# Patient Record
Sex: Female | Born: 1980 | Hispanic: Yes | Marital: Married
Health system: Southern US, Community
[De-identification: ages and names within clinical notes are randomized; demographics above are authoritative.]

## PROBLEM LIST (undated history)

## (undated) DIAGNOSIS — Z789 Other specified health status: Secondary | ICD-10-CM

---

## 2007-04-17 ENCOUNTER — Ambulatory Visit: Payer: Self-pay | Admitting: Family Medicine

## 2007-06-02 ENCOUNTER — Observation Stay: Payer: Self-pay

## 2007-08-13 ENCOUNTER — Encounter: Payer: Self-pay | Admitting: Maternal and Fetal Medicine

## 2007-09-29 ENCOUNTER — Observation Stay: Payer: Self-pay | Admitting: Obstetrics and Gynecology

## 2007-10-08 ENCOUNTER — Inpatient Hospital Stay: Payer: Self-pay

## 2008-03-28 ENCOUNTER — Ambulatory Visit: Payer: Self-pay | Admitting: Family Medicine

## 2009-11-16 ENCOUNTER — Ambulatory Visit: Payer: Self-pay | Admitting: Certified Nurse Midwife

## 2010-05-02 ENCOUNTER — Inpatient Hospital Stay: Payer: Self-pay | Admitting: Obstetrics and Gynecology

## 2012-04-17 ENCOUNTER — Emergency Department: Payer: Self-pay | Admitting: Emergency Medicine

## 2012-04-17 LAB — CBC WITH DIFFERENTIAL/PLATELET
Basophil %: 0.4 %
HCT: 36.1 % (ref 35.0–47.0)
HGB: 12 g/dL (ref 12.0–16.0)
Lymphocyte #: 3 10*3/uL (ref 1.0–3.6)
Lymphocyte %: 40.7 %
MCHC: 33.3 g/dL (ref 32.0–36.0)
MCV: 83 fL (ref 80–100)
Monocyte #: 0.4 x10 3/mm (ref 0.2–0.9)
Neutrophil #: 3.8 10*3/uL (ref 1.4–6.5)
Neutrophil %: 50.9 %
Platelet: 432 10*3/uL (ref 150–440)
RBC: 4.37 10*6/uL (ref 3.80–5.20)

## 2012-04-17 LAB — URINALYSIS, COMPLETE
Ph: 6 (ref 4.5–8.0)
RBC,UR: <1 /HPF (ref 0–5)
Specific Gravity: 1.005 (ref 1.003–1.030)
WBC UR: 2 /HPF (ref 0–5)

## 2012-04-17 LAB — LIPASE, BLOOD: Lipase: 100 U/L (ref 73–393)

## 2012-04-17 LAB — COMPREHENSIVE METABOLIC PANEL
Alkaline Phosphatase: 114 U/L (ref 50–136)
Anion Gap: 10 (ref 7–16)
BUN: 8 mg/dL (ref 7–18)
Bilirubin,Total: 0.3 mg/dL (ref 0.2–1.0)
Calcium, Total: 8.9 mg/dL (ref 8.5–10.1)
Chloride: 103 mmol/L (ref 98–107)
Creatinine: 0.43 mg/dL — ABNORMAL LOW (ref 0.60–1.30)
EGFR (African American): >60
Potassium: 4 mmol/L (ref 3.5–5.1)
SGOT(AST): 29 U/L (ref 15–37)
SGPT (ALT): 42 U/L
Total Protein: 8.1 g/dL (ref 6.4–8.2)

## 2012-06-08 ENCOUNTER — Ambulatory Visit: Payer: Self-pay | Admitting: Surgery

## 2012-06-08 LAB — PREGNANCY, URINE: Pregnancy Test, Urine: NEGATIVE m[IU]/mL

## 2012-06-10 ENCOUNTER — Ambulatory Visit: Payer: Self-pay | Admitting: Surgery

## 2012-06-11 LAB — PATHOLOGY REPORT

## 2014-09-09 ENCOUNTER — Emergency Department: Payer: Self-pay | Admitting: Emergency Medicine

## 2014-09-09 LAB — CBC
HCT: 34.7 % — AB (ref 35.0–47.0)
HGB: 11.5 g/dL — ABNORMAL LOW (ref 12.0–16.0)
MCH: 27.7 pg (ref 26.0–34.0)
MCHC: 33.2 g/dL (ref 32.0–36.0)
MCV: 84 fL (ref 80–100)
PLATELETS: 344 10*3/uL (ref 150–440)
RBC: 4.15 10*6/uL (ref 3.80–5.20)
RDW: 14.6 % — ABNORMAL HIGH (ref 11.5–14.5)
WBC: 8.9 10*3/uL (ref 3.6–11.0)

## 2014-09-09 LAB — HCG, QUANTITATIVE, PREGNANCY: Beta Hcg, Quant.: 76209 m[IU]/mL — ABNORMAL HIGH

## 2014-09-10 LAB — WET PREP, GENITAL

## 2014-09-11 LAB — GC/CHLAMYDIA PROBE AMP

## 2014-11-04 ENCOUNTER — Ambulatory Visit: Payer: Self-pay | Admitting: Family Medicine

## 2015-02-07 NOTE — Op Note (Signed)
PATIENT NAME:  Kathleen Lang, [name] MR#:  [id number] DATE OF BIRTH:  1981/07/02  DATE OF PROCEDURE:  06/10/2012  PREOPERATIVE DIAGNOSIS: Chronic cholecystitis, cholelithiasis.   POSTOPERATIVE DIAGNOSIS: Chronic cholecystitis, cholelithiasis.  PROCEDURE: Laparoscopic cholecystectomy.   SURGEON: Adella HareJ. Wilton Smith, MD   ANESTHESIA: General.   INDICATIONS: This 34 year old female has a history of epigastric pain. She had ultrasound findings of gallstones and surgery was recommended for definitive treatment.   DESCRIPTION OF PROCEDURE: The patient was placed on the operating table in the supine position under general endotracheal anesthesia. The abdomen was prepared with ChloraPrep and draped in a sterile manner.   An incision was made deeply within the umbilicus approximately 12 mm in length, carried down through subcutaneous tissues. The deep fascia was grasped with laryngeal hook and elevated. A Veress needle was inserted, aspirated, and irrigated with a saline solution. Next, the peritoneal cavity was inflated with carbon dioxide. The Veress needle was removed. The 10 mm cannula was inserted. The 10 mm 0 degree laparoscope was inserted to view the peritoneal cavity. Initial inspection revealed there was some gaseous distention of the stomach and I had the anesthetist insert an orogastric tube to decompress the stomach. The liver had a fatty appearance with a smooth surface. Other parts of intestine and omentum were otherwise typical. Another incision was made in the epigastrium slightly to the right of the midline to introduce an 11 mm cannula. Two incisions were made in the lateral aspect of the right upper quadrant to introduce two 5-mm cannulas.   The patient was placed in the reverse Trendelenburg position and turned some 5 degrees to the left. The gallbladder was retracted towards the right shoulder. Multiple adhesions were taken down with blunt and sharp dissection. The neck of the  gallbladder was retracted inferiorly and laterally. The porta hepatis was demonstrated. The cystic duct was dissected free from surrounding structures. The cystic artery was dissected free from surrounding structures. The neck of the gallbladder was mobilized with incision of the visceral peritoneum. A critical view of safety was demonstrated. An Endoclip was placed across the cystic duct adjacent to the neck of the gallbladder. An incision was made in the cystic duct to thread the Reddick catheter, however, the cystic duct was found to be very small and the Reddick catheter would not thread and, therefore, cholangiogram was not done. The cystic duct was doubly ligated with endoclips and divided. The cystic artery was controlled with double endoclips and divided. The gallbladder was further dissected free from the liver with hook and cautery. Bleeding was very minimal. Hemostasis was subsequently intact. Just a small amount of blood was aspirated. The gallbladder was completely separated and was brought up through the infraumbilical incision, opened and suctioned. Multiple stones were removed with the stone scoop and the gallbladder with stones was submitted for routine pathology. The right upper quadrant was further inspected. Hemostasis was intact. The cannulas were removed. Carbon dioxide was allowed to escape from the peritoneal cavity. Skin incisions were closed with interrupted 5-0 chromic subcuticular sutures, benzoin, and Steri-Strips. Dressings were applied with paper tape. The patient tolerated surgery satisfactorily and was then prepared for transfer to the recovery room.   ____________________________ Shela CommonsJ. Renda RollsWilton Smith, MD jws:drc D: 06/10/2012 13:06:49 ET T: 06/10/2012 13:28:06 ET JOB#: [id number]  cc: Adella HareJ. Wilton Smith, MD, <Dictator> Adella HareWILTON J SMITH MD ELECTRONICALLY SIGNED 06/11/2012 20:20

## 2015-04-05 ENCOUNTER — Inpatient Hospital Stay
Admission: EM | Admit: 2015-04-05 | Discharge: 2015-04-08 | DRG: 765 | Disposition: A | Discharge status: Home / Self Care | Payer: Medicaid Other | Attending: Obstetrics and Gynecology | Admitting: Obstetrics and Gynecology

## 2015-04-05 ENCOUNTER — Inpatient Hospital Stay: Payer: Medicaid Other | Admitting: Anesthesiology

## 2015-04-05 ENCOUNTER — Encounter
Admission: EM | Disposition: A | Discharge status: Home / Self Care | Payer: Self-pay | Source: Home / Self Care | Attending: Obstetrics and Gynecology

## 2015-04-05 DIAGNOSIS — O2653 Maternal hypotension syndrome, third trimester: Secondary | ICD-10-CM | POA: Diagnosis not present

## 2015-04-05 DIAGNOSIS — O321XX0 Maternal care for breech presentation, not applicable or unspecified: Principal | ICD-10-CM | POA: Diagnosis present

## 2015-04-05 DIAGNOSIS — R42 Dizziness and giddiness: Secondary | ICD-10-CM | POA: Diagnosis not present

## 2015-04-05 DIAGNOSIS — Z302 Encounter for sterilization: Secondary | ICD-10-CM | POA: Diagnosis not present

## 2015-04-05 DIAGNOSIS — O480 Post-term pregnancy: Secondary | ICD-10-CM | POA: Diagnosis present

## 2015-04-05 DIAGNOSIS — Z23 Encounter for immunization: Secondary | ICD-10-CM

## 2015-04-05 DIAGNOSIS — Z3A39 39 weeks gestation of pregnancy: Secondary | ICD-10-CM | POA: Diagnosis present

## 2015-04-05 DIAGNOSIS — Z98891 History of uterine scar from previous surgery: Secondary | ICD-10-CM

## 2015-04-05 HISTORY — DX: Other specified health status: Z78.9

## 2015-04-05 LAB — CBC
HCT: 33.2 % — ABNORMAL LOW (ref 35.0–47.0)
HEMATOCRIT: 32.4 % — AB (ref 35.0–47.0)
HEMOGLOBIN: 9.8 g/dL — AB (ref 12.0–16.0)
Hemoglobin: 10.3 g/dL — ABNORMAL LOW (ref 12.0–16.0)
MCH: 22.3 pg — ABNORMAL LOW (ref 26.0–34.0)
MCH: 23.9 pg — AB (ref 26.0–34.0)
MCHC: 30.3 g/dL — ABNORMAL LOW (ref 32.0–36.0)
MCHC: 31 g/dL — AB (ref 32.0–36.0)
MCV: 73.6 fL — AB (ref 80.0–100.0)
MCV: 77.1 fL — ABNORMAL LOW (ref 80.0–100.0)
Platelets: 424 10*3/uL (ref 150–440)
Platelets: 320 10*3/uL (ref 150–440)
RBC: 4.4 MIL/uL (ref 3.80–5.20)
RBC: 4.3 MIL/uL (ref 3.80–5.20)
RDW: 16.8 % — AB (ref 11.5–14.5)
RDW: 18 % — AB (ref 11.5–14.5)
WBC: 14.8 10*3/uL — ABNORMAL HIGH (ref 3.6–11.0)
WBC: 17.3 10*3/uL — ABNORMAL HIGH (ref 3.6–11.0)

## 2015-04-05 LAB — CBC WITH DIFFERENTIAL/PLATELET
Basophils Absolute: 0 10*3/uL (ref 0–0.1)
Basophils Relative: 0 %
Eosinophils Absolute: 0 10*3/uL (ref 0–0.7)
HCT: 24 % — ABNORMAL LOW (ref 35.0–47.0)
Hemoglobin: 7.7 g/dL — ABNORMAL LOW (ref 12.0–16.0)
LYMPHS ABS: 2 10*3/uL (ref 1.0–3.6)
Lymphocytes Relative: 16 %
MCH: 23.6 pg — ABNORMAL LOW (ref 26.0–34.0)
MCHC: 31.9 g/dL — ABNORMAL LOW (ref 32.0–36.0)
MCV: 74 fL — AB (ref 80.0–100.0)
Monocytes Absolute: 0.6 10*3/uL (ref 0.2–0.9)
Monocytes Relative: 5 %
Neutro Abs: 10 10*3/uL — ABNORMAL HIGH (ref 1.4–6.5)
Neutrophils Relative %: 79 %
Platelets: 229 10*3/uL (ref 150–440)
RBC: 3.24 MIL/uL — ABNORMAL LOW (ref 3.80–5.20)
RDW: 17.6 % — ABNORMAL HIGH (ref 11.5–14.5)
WBC: 12.6 10*3/uL — ABNORMAL HIGH (ref 3.6–11.0)

## 2015-04-05 LAB — PROTIME-INR
INR: 1.24
INR: 1.09
PROTHROMBIN TIME: 15.8 s — AB (ref 11.4–15.0)
Prothrombin Time: 14.3 seconds (ref 11.4–15.0)

## 2015-04-05 LAB — FIBRINOGEN
FIBRINOGEN: 287 mg/dL (ref 210–470)
FIBRINOGEN: 385 mg/dL (ref 210–470)

## 2015-04-05 LAB — OB RESULTS CONSOLE HEPATITIS B SURFACE ANTIGEN: Hepatitis B Surface Ag: NEGATIVE

## 2015-04-05 LAB — APTT
aPTT: 26 seconds (ref 24–36)
aPTT: 28 seconds (ref 24–36)

## 2015-04-05 LAB — OB RESULTS CONSOLE HIV ANTIBODY (ROUTINE TESTING): HIV: NONREACTIVE

## 2015-04-05 LAB — PREPARE RBC (CROSSMATCH)

## 2015-04-05 LAB — ABO/RH: ABO/RH(D): A POS

## 2015-04-05 LAB — OB RESULTS CONSOLE GBS: GBS: NEGATIVE

## 2015-04-05 SURGERY — Surgical Case
Anesthesia: Spinal | Wound class: Clean Contaminated

## 2015-04-05 MED ORDER — METHYLERGONOVINE MALEATE 0.2 MG/ML IJ SOLN
INTRAMUSCULAR | Status: AC
  Administered 2015-04-05: .2 mg via INTRAMUSCULAR
  Filled 2015-04-05: qty 1

## 2015-04-05 MED ORDER — MENTHOL 3 MG MT LOZG: 1.0000 | LOZENGE | OROMUCOSAL | Status: DC | PRN

## 2015-04-05 MED ORDER — BUPIVACAINE IN DEXTROSE 0.75-8.25 % IT SOLN
INTRATHECAL | Status: DC | PRN
Start: 2015-04-05 — End: 2015-04-05

## 2015-04-05 MED ORDER — ONDANSETRON HCL 4 MG/2ML IJ SOLN: 4.0000 mg | Freq: Three times a day (TID) | INTRAMUSCULAR | Status: DC | PRN

## 2015-04-05 MED ORDER — SIMETHICONE 80 MG PO CHEW
80.0000 mg | CHEWABLE_TABLET | Freq: Three times a day (TID) | ORAL | Status: DC
  Administered 2015-04-05 – 2015-04-07 (×2): 80 mg via ORAL
  Filled 2015-04-05 (×4): qty 1

## 2015-04-05 MED ORDER — LACTATED RINGERS IV SOLN: INTRAVENOUS | Status: DC

## 2015-04-05 MED ORDER — LANOLIN HYDROUS EX OINT
1.0000 | TOPICAL_OINTMENT | CUTANEOUS | Status: DC | PRN
Start: 2015-04-05 — End: 2015-04-08

## 2015-04-05 MED ORDER — FENTANYL CITRATE (PF) 100 MCG/2ML IJ SOLN
INTRAMUSCULAR | Status: DC | PRN
  Administered 2015-04-05: 15 ug via INTRATHECAL

## 2015-04-05 MED ORDER — ONDANSETRON HCL 4 MG/2ML IJ SOLN: 4.0000 mg | Freq: Once | INTRAMUSCULAR | Status: DC | PRN

## 2015-04-05 MED ORDER — LACTATED RINGERS IV SOLN: 500.0000 mL | INTRAVENOUS | Status: DC | PRN

## 2015-04-05 MED ORDER — OXYCODONE-ACETAMINOPHEN 5-325 MG PO TABS
2.0000 | ORAL_TABLET | ORAL | Status: DC | PRN
  Administered 2015-04-06 – 2015-04-07 (×3): 2 via ORAL
  Filled 2015-04-05 (×2): qty 2

## 2015-04-05 MED ORDER — TETANUS-DIPHTH-ACELL PERTUSSIS 5-2.5-18.5 LF-MCG/0.5 IM SUSP
0.5000 mL | Freq: Once | INTRAMUSCULAR | Status: AC
  Administered 2015-04-08: 0.5 mL via INTRAMUSCULAR
  Filled 2015-04-05: qty 0.5

## 2015-04-05 MED ORDER — MEPERIDINE HCL 25 MG/ML IJ SOLN
6.2500 mg | INTRAMUSCULAR | Status: DC | PRN
  Administered 2015-04-05: 6.25 mg via INTRAVENOUS
  Filled 2015-04-05: qty 1

## 2015-04-05 MED ORDER — NALBUPHINE HCL 10 MG/ML IJ SOLN
5.0000 mg | Freq: Once | INTRAMUSCULAR | Status: AC | PRN
  Filled 2015-04-05: qty 0.5

## 2015-04-05 MED ORDER — BUPIVACAINE HCL (PF) 0.5 % IJ SOLN
INTRAMUSCULAR | Status: DC | PRN
  Administered 2015-04-05: 10 mL

## 2015-04-05 MED ORDER — OXYTOCIN 40 UNITS IN LACTATED RINGERS INFUSION - SIMPLE MED
INTRAVENOUS | Status: DC | PRN
  Administered 2015-04-05: 40 mL via INTRAVENOUS
  Administered 2015-04-05: 40 [IU]

## 2015-04-05 MED ORDER — EPHEDRINE SULFATE 50 MG/ML IJ SOLN
INTRAMUSCULAR | Status: DC | PRN
  Administered 2015-04-05 (×3): 10 mg via INTRAVENOUS

## 2015-04-05 MED ORDER — ONDANSETRON HCL 4 MG/2ML IJ SOLN
INTRAMUSCULAR | Status: DC | PRN
  Administered 2015-04-05: 4 mg via INTRAVENOUS

## 2015-04-05 MED ORDER — WITCH HAZEL-GLYCERIN EX PADS: 1.0000 "application " | MEDICATED_PAD | CUTANEOUS | Status: DC | PRN

## 2015-04-05 MED ORDER — SODIUM CHLORIDE 0.9 % IJ SOLN
INTRAMUSCULAR | Status: AC
  Administered 2015-04-05: 3 mL via INTRAVENOUS
  Filled 2015-04-05: qty 3

## 2015-04-05 MED ORDER — OXYCODONE-ACETAMINOPHEN 5-325 MG PO TABS: 1.0000 | ORAL_TABLET | ORAL | Status: DC | PRN

## 2015-04-05 MED ORDER — NALOXONE HCL 0.4 MG/ML IJ SOLN: 0.4000 mg | INTRAMUSCULAR | Status: DC | PRN

## 2015-04-05 MED ORDER — SENNOSIDES-DOCUSATE SODIUM 8.6-50 MG PO TABS
2.0000 | ORAL_TABLET | ORAL | Status: DC
  Administered 2015-04-07: 2 via ORAL
  Filled 2015-04-05: qty 2

## 2015-04-05 MED ORDER — PRENATAL MULTIVITAMIN CH
1.0000 | ORAL_TABLET | Freq: Every day | ORAL | Status: DC
  Administered 2015-04-05 – 2015-04-08 (×3): 1 via ORAL
  Filled 2015-04-05 (×2): qty 1

## 2015-04-05 MED ORDER — ACETAMINOPHEN 325 MG PO TABS: 650.0000 mg | ORAL_TABLET | ORAL | Status: DC | PRN

## 2015-04-05 MED ORDER — BUPIVACAINE 0.25 % ON-Q PUMP DUAL CATH 400 ML
INJECTION | Status: AC
  Filled 2015-04-05: qty 400

## 2015-04-05 MED ORDER — DIBUCAINE 1 % RE OINT: 1.0000 "application " | TOPICAL_OINTMENT | RECTAL | Status: DC | PRN

## 2015-04-05 MED ORDER — PHENYLEPHRINE HCL 10 MG/ML IJ SOLN
INTRAMUSCULAR | Status: DC | PRN
  Administered 2015-04-05: 200 ug via INTRAVENOUS
  Administered 2015-04-05 (×2): 100 ug via INTRAVENOUS

## 2015-04-05 MED ORDER — MEASLES, MUMPS & RUBELLA VAC ~~LOC~~ INJ
0.5000 mL | INJECTION | Freq: Once | SUBCUTANEOUS | Status: DC
  Filled 2015-04-05: qty 0.5

## 2015-04-05 MED ORDER — CEFAZOLIN SODIUM-DEXTROSE 2-3 GM-% IV SOLR
2.0000 g | INTRAVENOUS | Status: AC
  Administered 2015-04-05: 2 g via INTRAVENOUS

## 2015-04-05 MED ORDER — MORPHINE SULFATE (PF) 0.5 MG/ML IJ SOLN
INTRAMUSCULAR | Status: DC | PRN
  Administered 2015-04-05: .15 mg via INTRATHECAL

## 2015-04-05 MED ORDER — OXYTOCIN 10 UNIT/ML IJ SOLN
INTRAMUSCULAR | Status: AC
  Filled 2015-04-05: qty 4

## 2015-04-05 MED ORDER — DIPHENHYDRAMINE HCL 50 MG/ML IJ SOLN
12.5000 mg | INTRAMUSCULAR | Status: DC | PRN
  Administered 2015-04-05: 12.5 mg via INTRAVENOUS
  Filled 2015-04-05: qty 1

## 2015-04-05 MED ORDER — BISACODYL 10 MG RE SUPP: 10.0000 mg | Freq: Every day | RECTAL | Status: DC | PRN

## 2015-04-05 MED ORDER — FENTANYL CITRATE (PF) 100 MCG/2ML IJ SOLN: 25.0000 ug | INTRAMUSCULAR | Status: DC | PRN

## 2015-04-05 MED ORDER — SIMETHICONE 80 MG PO CHEW
80.0000 mg | CHEWABLE_TABLET | ORAL | Status: DC | PRN
  Administered 2015-04-06 – 2015-04-08 (×2): 80 mg via ORAL

## 2015-04-05 MED ORDER — NALOXONE HCL 1 MG/ML IJ SOLN
1.0000 ug/kg/h | INTRAVENOUS | Status: DC | PRN
  Filled 2015-04-05: qty 2

## 2015-04-05 MED ORDER — SODIUM CHLORIDE 0.9 % IJ SOLN
3.0000 mL | INTRAMUSCULAR | Status: DC | PRN
  Administered 2015-04-05: 3 mL via INTRAVENOUS
  Filled 2015-04-05: qty 10

## 2015-04-05 MED ORDER — BUPIVACAINE 0.25 % ON-Q PUMP DUAL CATH 400 ML: 400.0000 mL | INJECTION | Status: DC

## 2015-04-05 MED ORDER — SODIUM CHLORIDE 0.9 % IV SOLN
INTRAVENOUS | Status: DC | PRN
  Administered 2015-04-05: 11:00:00 via INTRAVENOUS

## 2015-04-05 MED ORDER — LACTATED RINGERS IV SOLN
INTRAVENOUS | Status: DC
  Administered 2015-04-05: 10:00:00 via INTRAVENOUS
  Administered 2015-04-05: 125 mL via INTRAVENOUS

## 2015-04-05 MED ORDER — SIMETHICONE 80 MG PO CHEW
80.0000 mg | CHEWABLE_TABLET | ORAL | Status: DC
  Administered 2015-04-07: 80 mg via ORAL
  Filled 2015-04-05: qty 1

## 2015-04-05 MED ORDER — DIPHENHYDRAMINE HCL 25 MG PO CAPS: 25.0000 mg | ORAL_CAPSULE | Freq: Four times a day (QID) | ORAL | Status: DC | PRN

## 2015-04-05 MED ORDER — NALBUPHINE HCL 10 MG/ML IJ SOLN
5.0000 mg | INTRAMUSCULAR | Status: DC | PRN
  Filled 2015-04-05: qty 0.5

## 2015-04-05 MED ORDER — BUPIVACAINE HCL (PF) 0.5 % IJ SOLN
INTRAMUSCULAR | Status: AC
  Filled 2015-04-05: qty 30

## 2015-04-05 MED ORDER — FLEET ENEMA 7-19 GM/118ML RE ENEM: 1.0000 | ENEMA | Freq: Every day | RECTAL | Status: DC | PRN

## 2015-04-05 MED ORDER — DIPHENHYDRAMINE HCL 25 MG PO CAPS
25.0000 mg | ORAL_CAPSULE | ORAL | Status: DC | PRN
Start: 2015-04-05 — End: 2015-04-08

## 2015-04-05 MED ORDER — BUPIVACAINE IN DEXTROSE 0.75-8.25 % IT SOLN
INTRATHECAL | Status: DC | PRN
  Administered 2015-04-05: 1.6 mL via INTRATHECAL

## 2015-04-05 MED ORDER — HYDROMORPHONE HCL 1 MG/ML IJ SOLN: 0.2500 mg | INTRAMUSCULAR | Status: DC | PRN

## 2015-04-05 MED ORDER — IBUPROFEN 600 MG PO TABS
600.0000 mg | ORAL_TABLET | Freq: Four times a day (QID) | ORAL | Status: DC
  Administered 2015-04-05 – 2015-04-08 (×7): 600 mg via ORAL
  Filled 2015-04-05 (×6): qty 1

## 2015-04-05 SURGICAL SUPPLY — 27 items
BARRIER ADHS 3X4 INTERCEED (GAUZE/BANDAGES/DRESSINGS) ×4 IMPLANT
CANISTER SUCT 3000ML (MISCELLANEOUS) ×4 IMPLANT
CATH KIT ON-Q SILVERSOAK 5IN (CATHETERS) ×8 IMPLANT
CHLORAPREP W/TINT 26ML (MISCELLANEOUS) ×4 IMPLANT
DRSG OPSITE POSTOP 4X12 (GAUZE/BANDAGES/DRESSINGS) ×4 IMPLANT
DRSG TELFA 3X8 NADH (GAUZE/BANDAGES/DRESSINGS) ×4 IMPLANT
GAUZE SPONGE 4X4 12PLY STRL (GAUZE/BANDAGES/DRESSINGS) ×4 IMPLANT
GOWN STRL REUS W/ TWL LRG LVL3 (GOWN DISPOSABLE) ×6 IMPLANT
GOWN STRL REUS W/TWL LRG LVL3 (GOWN DISPOSABLE) ×6
LIQUID BAND (GAUZE/BANDAGES/DRESSINGS) ×4 IMPLANT
NS IRRIG 1000ML POUR BTL (IV SOLUTION) ×4 IMPLANT
PAD GROUND ADULT SPLIT (MISCELLANEOUS) ×4 IMPLANT
PAD OB MATERNITY 4.3X12.25 (PERSONAL CARE ITEMS) ×4 IMPLANT
PAD PREP 24X41 OB/GYN DISP (PERSONAL CARE ITEMS) ×4 IMPLANT
SPONGE LAP 18X18 5 PK (GAUZE/BANDAGES/DRESSINGS) ×12 IMPLANT
STAPLER INSORB 30 2030 C-SECTI (MISCELLANEOUS) ×4 IMPLANT
SUT MNCRL 4-0 (SUTURE)
SUT MNCRL 4-0 27XMFL (SUTURE)
SUT MNCRL AB 4-0 PS2 18 (SUTURE) ×4 IMPLANT
SUT PLAIN GUT 0 (SUTURE) ×8 IMPLANT
SUT VIC AB 0 CT1 36 (SUTURE) ×36 IMPLANT
SUT VIC AB 0 CTX 36 (SUTURE) ×10
SUT VIC AB 0 CTX36XBRD ANBCTRL (SUTURE) ×10 IMPLANT
SUT VIC AB 3-0 SH 27 (SUTURE) ×2
SUT VIC AB 3-0 SH 27X BRD (SUTURE) ×2 IMPLANT
SUT VICRYL/POLYSORB 3.0 (SUTURE) ×4 IMPLANT
SUTURE MNCRL 4-0 27XMF (SUTURE) IMPLANT

## 2015-04-05 NOTE — H&P (Signed)
Semira A Irina Castellano is a 34 y.o. [phone] female presenting for active labor, and dated by a 22 wk ultrasound. When I arrived she was 8 cm dilated with a bulging bag and contracting q2 min actively. Bedside ultrasound revealed frank breech presentation with fetal head in maternal RUQ.   History  OB History    No data available     No past medical history on file. No past surgical history on file. Family History: family history is not on file. Social History:  has no tobacco, alcohol, and drug history on file.   Prenatal Transfer Tool  Maternal Diabetes: No Genetic Screening: Unknonw Maternal Ultrasounds/Referrals: Normal Fetal Ultrasounds or other Referrals:  None Maternal Substance Abuse:  No Significant Maternal Medications:  None Significant Maternal Lab Results:  None Other Comments:  None  ROS  Dilation: 8 Effacement (%): 90 Station: -2 Exam by:: BB Blood pressure 124/84, pulse 95, temperature 99.2 F (37.3 C), temperature source Oral, resp. rate 20, height 4\' 4"  (1.321 m), weight 200 lb (90.719 kg). Exam Physical Exam  Prenatal labs: ABO, Rh:   Antibody:   Rubella:   RPR:    HBsAg:    HIV:    GBS:     Assessment/Plan: Stat c/s called for breech presentation with advanced cervical dilation. Spinal anesthesia planned. Pt's chart does confirm desired BTL and I confirmed this plan with the patient and her partner, which I will perform at the time of C/S.  Christeen Douglas 04/05/2015, 9:56 AM

## 2015-04-05 NOTE — Op Note (Signed)
S/p stat cesarean delivery for breech presentation with EBL 2700 and deep vaginal laceration. Currently EMR is limiting my access to L&D delivery summary but I will return to complete the documentation as soon as this problem is addressed.  Brief op note: Procedure- pLTCS through Pfannenstiel incision with BTL by modified Parkland method, repair of vaginal laceration, placement of On-Q pump Anesthesia- spinal Surgeon: Linwood Dibbles Assistant: TSchermerhorn  Specimens: Cord gas, placenta Baby AGPARS: 5 and 7 at 1 and 5 min.  EBL 2700 IVF 1600 Transfusion: 2 u pRBC, 2 u FFP 0.2 mg methergine given Both phenylephrine and ephedrine given for hypotension during the procedure.

## 2015-04-05 NOTE — Progress Notes (Signed)
Patient ID: Kathleen Lang, female   DOB: Aug 10, 1981, 34 y.o.   MRN: [id number]   Reason for calling a stat c/s discussed with patient and her partner. All questions answered.  The risks of cesarean section discussed with the patient included but were not limited to: bleeding which may require transfusion or reoperation; infection which may require antibiotics; injury to bowel, bladder, ureters or other surrounding organs; injury to the fetus; need for additional procedures including hysterectomy in the event of a life-threatening hemorrhage; placental abnormalities wth subsequent pregnancies, incisional problems, thromboembolic phenomenon and other postoperative/anesthesia complications. The patient concurred with the proposed plan, giving informed written consent for the procedure.  Anesthesia and OR aware. Preoperative prophylactic antibiotics and SCDs ordered on call to the OR.  To OR when ready.

## 2015-04-05 NOTE — OB Triage Note (Signed)
Woke up at 5:00 am with contractions. Painful, rates 9/10. G5P4. Speaks English, however Spanish is native language. Husband speaks some English, do not want an interpreter at this time. EFM applied. Loyola Mast, RN

## 2015-04-05 NOTE — Progress Notes (Signed)
Took over care of labor evaluation.  Patient c/o uc's that began around 0620 this morning.  Denies leakage of fluid or decreased fetal movement.

## 2015-04-05 NOTE — Anesthesia Preprocedure Evaluation (Signed)
Anesthesia Evaluation  Patient identified by MRN, date of birth, ID band Patient awake    Reviewed: Allergy & Precautions, NPO status , Patient's Chart, lab work & pertinent test resultsPreop documentation limited or incomplete due to emergent nature of procedure.  History of Anesthesia Complications Negative for: history of anesthetic complications  Airway Mallampati: II       Dental no notable dental hx.    Pulmonary neg pulmonary ROS,    Pulmonary exam normal       Cardiovascular negative cardio ROS Normal cardiovascular exam    Neuro/Psych negative neurological ROS  negative psych ROS   GI/Hepatic negative GI ROS, Neg liver ROS,   Endo/Other  negative endocrine ROS  Renal/GU negative Renal ROS  negative genitourinary   Musculoskeletal negative musculoskeletal ROS (+)   Abdominal   Peds negative pediatric ROS (+)  Hematology negative hematology ROS (+)   Anesthesia Other Findings   Reproductive/Obstetrics negative OB ROS                             Anesthesia Physical Anesthesia Plan  ASA: II and emergent  Anesthesia Plan: Spinal   Post-op Pain Management:    Induction:   Airway Management Planned: Nasal Cannula  Additional Equipment:   Intra-op Plan:   Post-operative Plan:   Informed Consent: I have reviewed the patients History and Physical, chart, labs and discussed the procedure including the risks, benefits and alternatives for the proposed anesthesia with the patient or authorized representative who has indicated his/her understanding and acceptance.     Plan Discussed with: CRNA and Surgeon  Anesthesia Plan Comments:         Anesthesia Quick Evaluation

## 2015-04-05 NOTE — Op Note (Signed)
Operatative Note  Pre-Op Dx:   1. Active labor at term 2. Breech presentation 3. Advanced cervical dilation 4. Desires sterility  Post-Op Dx:   Same Delivery of viable female infant  Procedure:   1. Primary low transverse cesarean section through a Pfannensteil skin incision 2. Repair of vaginal laceration 3. Bilateral tubal ligation through a modified Parkland method  Surgeon: Christeen Douglas MD, MPH Assitant:  Jennell Corner, MD   Anesthesia:   Spinal  with Duramorph; On Q pump placed  EBL:  2700cc IVF:  1600cc  Antibiotics:  Ancef 2g given prior to incision  DVT prophylaxis: SCDs  Drains: Foley catheter to straight drain  Specimen(s):   1. Portions bilateral tubes 2. Cord gas 3. Placenta  Findings:   Wound class 2 1. LBV female infant weighing 2730g, Apgars of 5 and 7 at 1 minute and 5 minutes, respectively. Infant was floppy on delivery and required some resuscitation.  2. Placenta delivered intact with 3 vessel cord.  3. Uterus, tubes, and ovaries grossly benign. 4. Adhesions: none 5. Anterior vaginal wall avulsed from inferior edge of cervix/lower uterine segment. Dark blood noted on uterine entry. Clear fluid.   Complications:  Intrapartum hemorrhage requiring 2u pRBC and 1 u FFP transfusion; repair of vaginal laceration.  Disposition:  To PACU in stable condition.   Technique:   The patient was taken to the operating room and provided with adequate anesthesia as noted above.  She was prepped and draped in the normal sterile fashion in the dorsal supine position with a leftward tilt. A time-out was taken to ensure the correct patient would have the correct procedure, that her allergies were identified, and that antibiotics had been given as listed above. A Pfannensteil skin incision was made with the scalpel and carried through to the underlying layer of fascia.  The fascia was incised in the midline and the incision extended laterally with lateral  traction. The fascia was retracted in cephalad/caudad fashion bluntly.  The underlying rectus muscles were dissected off with blunt retraction.  The rectus muscles were then separated in the midline, and the peritoneum identified, and entered bluntly. The peritoneal incision was extended superiorly and inferiorly with good visualization of the bladder.  The bladder blade was then inserted and the vesicouterine peritoneum identified.  The vesicouterine peritoneum was grasped with the pickups, entered sharply, and the bladder flap was created digitally.   A low transverse uterine incision was made slightly superior to the peritoneal reflection with the scalpel above the bladder reflection and extended in a cephalad/caudal fashion bluntly.  The fetal sacrum was noted at the hysterotomy.The bladder blade was removed and the infant's buttocks was delivered in a frank breech presentation. The normal breech maneuvers were applied and the remainder of the fetal extremities, body and head in a flexed position were delivered  atraumatically through the hysterotomy.  The cord was quickly clamped and cut, and the infant handed off to waiting pediatricians. Cord gases were sent.   The placenta was then removed with gentle traction on the cord and uterine massage. The uterus was cleared of clots and debris. Bleeding was brisk and the angles were tagged. A clear vaginal laceration was noted to the maternal left and this angle was difficult to visualize. The uterus was unable to be removed from the peritoneal cavity 2/2 atony at this time. 0.2mg  methergine IM was given and bimanual massage was performed. 40 mu postpartum pitocin was running.  The laceration was followed to its apex and  rapidly closed. The hysterotomy was then closed to control bleeding; however, once the field was fully visualized, it was noted that the anterior portion of the vagina was no longer attached to the lower uterine segment, and that the  hysterotomy closure had incorporated the posterior portion of the vagina only. This suture was removed and the left lateral vaginal wall was reapproximated deep in the pelvis and carried in a running locked fashion with 0-Vicryl suture to the level of the hysterotomy.    The uterus was then exteriorized. The uterine incision was then able to be repaired with 0-vicryl in a running locked fashion.  A second layer of the same suture was used in an  imbricating fashion to obtain hemostasis. Several figure of 8 sutures were used to achieve excellent hemostasis. The posterior cul-de-sac was cleared of all clots and debris.  Attention was then turned to the tubal ligation. Verbal consent was again obtained. The right fallopian tube distinguished from the round ligament by identifying the fimbria and was grasped with a Babcock clamp in the midisthmic portion approximately 3 cm from the cornual region. It was then doubly ligated with 0-plain gut suture in a Parkland fashion. The tubal segment was excised with Metzenbaum scissors. Tubal ostea noted. The procedure was repeated on the left side.   Attention was returned to the vaginal sidewalls and hemostasis was noted. The uterus was returned to the abdominal cavity.  The uterine incision was reinspected and found to be hemostatic.  *Care was noted to examine both tubal sites in situ to ensure the sutures were intact and no bleeding was noted.   The fascia was elevated and the rectus muscles and fascial surface were found to be hemostatic.  Several small air knots were placed in the rectus muscles to aid in fascial visualization. The On-Q pump was placed without complication.  The fascia was closed with 0-Vicryl in a running fashion.  The subcutaneous tissues were then irrigated and Bovie electrosurgery was used to complete hemostasis.   The subcutaneous tissue was closed with 2-0 Vicryl pieces in an interupted fashion. The skin was closed with Insorb staples l in a  subcuticular fashion and covered with "Honcycomb" and routine pressure dressing.  Sponge, lap, needle, and instrument counts were correct times two.   The patient tolerated the procedure well and was taken to the recovery room in stable condition.  Of note, during the procedure 2 u of pRBC were transfused, 1 u FFP and both phenylephrine and ephedrine were given to support maternal blood pressure. She was conscious and responsive throughout the case.  A certified medical interpreter was present throughout the case. All the patient and her partner's questions were answered.

## 2015-04-05 NOTE — Transfer of Care (Signed)
Immediate Anesthesia Transfer of Care Note  Patient: Kathleen Lang  Procedure(s) Performed: Procedure(s): CESAREAN SECTION WITH BILATERAL TUBAL LIGATION  Patient Location: PACU  Anesthesia Type:Spinal  Level of Consciousness: awake, alert  and oriented  Airway & Oxygen Therapy: Patient connected to face mask oxygen  Post-op Assessment: Report given to RN and Post -op Vital signs reviewed and stable  Post vital signs: Reviewed and stable  Last Vitals:  Filed Vitals:   04/05/15 0708  BP: 124/84  Pulse: 95  Temp: 37.3 C  Resp: 20    Complications: No apparent anesthesia complications

## 2015-04-05 NOTE — Anesthesia Procedure Notes (Addendum)
Spinal Patient location during procedure: OB Start time: 04/05/2015 10:00 AM End time: 04/05/2015 10:02 AM Staffing Anesthesiologist: Lorane Gell Performed by: anesthesiologist  Preanesthetic Checklist Completed: patient identified, site marked, surgical consent, pre-op evaluation, timeout performed, IV checked, risks and benefits discussed and monitors and equipment checked Spinal Block Patient position: sitting Prep: ChloraPrep Patient monitoring: heart rate, continuous pulse ox, blood pressure and cardiac monitor Approach: midline Location: L4-5 Injection technique: single-shot Needle Needle type: Whitacre and Introducer  Needle gauge: 24 G Needle length: 9 cm Additional Notes Negative paresthesia. Negative blood return. Positive free-flowing CSF. Expiration date of kit checked and confirmed. Patient tolerated procedure well, without complications.

## 2015-04-05 NOTE — Progress Notes (Signed)
S: MD called to bedside for RN concerns for vaginal bleeding. RN noted old, dark blood PV that seemed to not be related to abdominal checks. Ms. Marri Wojtkowiak has a firm fundus. She had blood that seeped out from vagina with pressure on the labia or movement of the legs.   O: BP 100/64 mmHg  Pulse 110  Temp(Src) 98.6 F (37 C) (Oral)  Resp 18  Ht 4\' 4"  (1.321 m)  Wt 90.719 kg (200 lb)  BMI 51.99 kg/m2  SpO2 100%  Breastfeeding? Unknown Upon entering the room, Ms. Coreen Petriello is comfortably breastfeeding her child in no apparent distress.  No blood on pad after ~5 mins of use. Digital exam did reveal old blood, but no suture line or lacerated portion was appreciated.   Small amount of Kerlex was placed as a means of packing and effort to soak up old blood in the vagina.  A/P:  I believe that the blood is normal lochia that is pooled in Ms. Cerda Rodriguez's vagina. She is sitting in a dependent position with her head up, legs slightly raised and her pelvis at the lowest point.   ---Check CBC, PT/PTT, INR, fibrinogen ---Continue foley, remove packing in 2 hours. ---Continue strict pad counts.   Teena Irani. Derrill Kay MD, MPH [facility] [phone] 10:15 PM

## 2015-04-06 LAB — CBC WITH DIFFERENTIAL/PLATELET
BASOS PCT: 0 %
Basophils Absolute: 0 10*3/uL (ref 0–0.1)
Eosinophils Absolute: 0.1 10*3/uL (ref 0–0.7)
Eosinophils Relative: 1 %
HCT: 21.3 % — ABNORMAL LOW (ref 35.0–47.0)
Hemoglobin: 6.7 g/dL — ABNORMAL LOW (ref 12.0–16.0)
LYMPHS PCT: 16 %
Lymphs Abs: 1.7 10*3/uL (ref 1.0–3.6)
MCH: 23.5 pg — ABNORMAL LOW (ref 26.0–34.0)
MCHC: 31.6 g/dL — ABNORMAL LOW (ref 32.0–36.0)
MCV: 74.5 fL — ABNORMAL LOW (ref 80.0–100.0)
MONOS PCT: 4 %
Monocytes Absolute: 0.5 10*3/uL (ref 0.2–0.9)
NEUTROS ABS: 8.5 10*3/uL — AB (ref 1.4–6.5)
Neutrophils Relative %: 79 %
Platelets: 217 10*3/uL (ref 150–440)
RBC: 2.86 MIL/uL — AB (ref 3.80–5.20)
RDW: 17.4 % — ABNORMAL HIGH (ref 11.5–14.5)
WBC: 10.7 10*3/uL (ref 3.6–11.0)

## 2015-04-06 LAB — FIBRINOGEN: Fibrinogen: 451 mg/dL (ref 210–470)

## 2015-04-06 LAB — PREPARE FRESH FROZEN PLASMA
Unit division: 0
Unit division: 0

## 2015-04-06 LAB — TYPE AND SCREEN
ABO/RH(D): A POS
Antibody Screen: NEGATIVE
Unit division: 0
Unit division: 0

## 2015-04-06 LAB — CORD BLOOD GAS (ARTERIAL)
ACID-BASE DEFICIT: 15.6 mmol/L — AB (ref 0.0–2.0)
Bicarbonate: 14.7 mEq/L — ABNORMAL LOW (ref 21.0–28.0)
pCO2 cord blood (arterial): 52 mmHg (ref 42.0–56.0)
pH cord blood (arterial): 7.06 — CL (ref 7.210–7.380)

## 2015-04-06 LAB — SURGICAL PATHOLOGY

## 2015-04-06 LAB — PROTIME-INR
INR: 1.02
PROTHROMBIN TIME: 13.6 s (ref 11.4–15.0)

## 2015-04-06 LAB — RPR: RPR Ser Ql: NONREACTIVE

## 2015-04-06 LAB — APTT: aPTT: 31 seconds (ref 24–36)

## 2015-04-06 MED ORDER — OXYTOCIN 40 UNITS IN LACTATED RINGERS INFUSION - SIMPLE MED
62.5000 mL/h | INTRAVENOUS | Status: DC
  Administered 2015-04-06: 62.5 mL/h via INTRAVENOUS
  Filled 2015-04-06: qty 1000

## 2015-04-06 MED ORDER — SODIUM CHLORIDE 0.9 % IV BOLUS (SEPSIS)
1000.0000 mL | Freq: Once | INTRAVENOUS | Status: AC
  Administered 2015-04-06: 1000 mL via INTRAVENOUS

## 2015-04-06 NOTE — Anesthesia Post-op Follow-up Note (Signed)
  Anesthesia Pain Follow-up Note  Patient: Kathleen Lang  Day #: 1  Date of Follow-up: 04/06/2015 Time: 7:27 AM  Last Vitals:  Filed Vitals:   04/06/15 0410  BP: 111/65  Pulse: 106  Temp: 36.9 C  Resp: 18    Level of Consciousness: alert  Pain: none   Side Effects:None  Catheter Site Exam:clean  Plan: D/C from anesthesia care  Rica Mast

## 2015-04-06 NOTE — Progress Notes (Signed)
Post Partum Day 1 s/p LTCS with blood transfusion  Subjective: no complaints  Objective: Blood pressure 112/67, pulse 106, temperature 98.2 F (36.8 C), temperature source Oral, resp. rate 20, height 4\' 4"  (1.321 m), weight 90.719 kg (200 lb), SpO2 98 %, unknown if currently breastfeeding.  Physical Exam:  General: alert and cooperative  Lungs CTA CV RRR  Lochia: appropriate Uterine Fundus: firm Incision: covered  DVT Evaluation: No evidence of DVT seen on physical exam.   Recent Labs  04/05/15 2147 04/06/15 0627  HGB 7.7* 6.7*  HCT 24.0* 21.3*    Assessment/Plan:  hemodynamically stable  D/c foley  Saline lock IV  Po meds  Repeat cbc in am  OOB with assistance   LOS: 1 day   Kathleen Lang 04/06/2015, 8:53 AM

## 2015-04-06 NOTE — Anesthesia Postprocedure Evaluation (Signed)
  Anesthesia Post-op Note  Patient: Kathleen Lang  Procedure(s) Performed: Procedure(s): CESAREAN SECTION WITH BILATERAL TUBAL LIGATION  Anesthesia type:Spinal  Patient location: 340  Post pain: Pain level controlled  Post assessment: Post-op Vital signs reviewed, Patient's Cardiovascular Status Stable, Respiratory Function Stable, Patent Airway and No signs of Nausea or vomiting  Post vital signs: Reviewed and stable  Last Vitals:  Filed Vitals:   04/06/15 0410  BP: 111/65  Pulse: 106  Temp: 36.9 C  Resp: 18    Level of consciousness: awake, alert  and patient cooperative  Complications: No apparent anesthesia complications

## 2015-04-07 LAB — CBC
HCT: 20.6 % — ABNORMAL LOW (ref 35.0–47.0)
Hemoglobin: 6.3 g/dL — ABNORMAL LOW (ref 12.0–16.0)
MCH: 23.2 pg — ABNORMAL LOW (ref 26.0–34.0)
MCHC: 30.5 g/dL — AB (ref 32.0–36.0)
MCV: 76 fL — ABNORMAL LOW (ref 80.0–100.0)
PLATELETS: 247 10*3/uL (ref 150–440)
RBC: 2.71 MIL/uL — ABNORMAL LOW (ref 3.80–5.20)
RDW: 17.8 % — AB (ref 11.5–14.5)
WBC: 12.9 10*3/uL — ABNORMAL HIGH (ref 3.6–11.0)

## 2015-04-07 LAB — PREPARE RBC (CROSSMATCH)

## 2015-04-07 MED ORDER — SODIUM CHLORIDE 0.9 % IV SOLN: Freq: Once | INTRAVENOUS | Status: DC

## 2015-04-07 MED ORDER — DIPHENHYDRAMINE HCL 25 MG PO CAPS
25.0000 mg | ORAL_CAPSULE | Freq: Once | ORAL | Status: AC
  Administered 2015-04-07: 25 mg via ORAL
  Filled 2015-04-07: qty 1

## 2015-04-07 MED ORDER — ACETAMINOPHEN 325 MG PO TABS
650.0000 mg | ORAL_TABLET | Freq: Once | ORAL | Status: AC
  Administered 2015-04-07: 650 mg via ORAL
  Filled 2015-04-07: qty 2

## 2015-04-07 NOTE — Progress Notes (Signed)
Subjective: Postpartum Day 2 Cesarean Delivery Patient reports incisional pain.  Moves very slowly.   Objective: Vital signs in last 24 hours: Temp:  [97.9 F (36.6 C)-99.5 F (37.5 C)] 99.5 F (37.5 C) (06/17 0741) Pulse Rate:  [106-114] 114 (06/17 0741) Resp:  [18-20] 18 (06/17 0741) BP: (113-124)/(66-74) 116/66 mmHg (06/17 0741) SpO2:  [97 %-99 %] 97 % (06/17 0741)  Physical Exam:  General: alert, awake and Oriented x 3 with Interpreter in room. Lungs CTA bilat, No W/R/R. Heart: S1S2, no M/R/G., R/R/R. Lochia: rubra mod amt.  Uterine Fundus: firm, no clots Incision: oozing bright red from Lt corner, intact wth plastic sutures. On-Q pump intact.  DVT Evaluation: No evidence of DVT seen on physical exam. ABD: Faint BS present, pt states she is passing gas. Tol po well, No N,V,D. Abd soft, NT,ND.  Recent Labs  04/06/15 0627 04/07/15 0653  HGB 6.7* 6.3*  HCT 21.3* 20.6*    Assessment/Plan: Status post Cesarean section. Postoperative course complicated by blood loss during surgery  P: 1. Continue current care. 2. Consider 2 upc as pt is symptomatic "lightheaded on sitting up", tachy on sitting. Will discuss with Dr Feliberto Gottron. 3. Ambulate today to increase abd BS.   JONES, CARON W 04/07/2015, 8:30 AM

## 2015-04-07 NOTE — Clinical Documentation Improvement (Signed)
Current documentation reflects operative estimated blood loss at 2700 cc, intrapartum hemorrhage requiring 2 units PRBC and 1 unit FFP transfusion, repair of vaginal laceration.  Labs below.  Please identify any additional associated clinical conditions and document in your progress note and carry over to the discharge summary.    The incomplete H&P currently notes "presenting for active labor, and dated by a 22 wk ultrasound."  Please also document the gestational age.      Component      RBC Hemoglobin HCT  Latest Ref Rng      3.80 - 5.20 MIL/uL 12.0 - 16.0 g/dL 78.6 - 75.4 %  4/92/0100     9:17 AM 4.40 9.8 (L) 32.4 (L)  04/05/2015     12:44 PM 4.30 10.3 (L) 33.2 (L)  04/05/2015     9:47 PM 3.24 (L) 7.7 (L) 24.0 (L)  04/06/2015      2.86 (L) 6.7 (L) 21.3 (L)  04/07/2015      2.71 (L) 6.3 (L) 20.6 (L)    Possible Clinical Conditions: -Acute blood loss anemia -Other condition (please specify) -Unable to determine at present  Thank you, Doy Mince, RN [phone] Clinical Documentation Specialist

## 2015-04-07 NOTE — Progress Notes (Signed)
Patient ID: Kathleen Lang, female   DOB: [birth year], 34 y.o.   MRN: [id number]  After discussion with Dr Feliberto Gottron, orders placed to transfuse 2 units of packed cells today due to pt being lightheaded and also tachycardic with activity.

## 2015-04-08 LAB — TYPE AND SCREEN
ABO/RH(D): A POS
ANTIBODY SCREEN: NEGATIVE
UNIT DIVISION: 0
UNIT DIVISION: 0

## 2015-04-08 LAB — HEMOGLOBIN AND HEMATOCRIT, BLOOD
HCT: 25.9 % — ABNORMAL LOW (ref 35.0–47.0)
Hemoglobin: 8.5 g/dL — ABNORMAL LOW (ref 12.0–16.0)

## 2015-04-08 MED ORDER — OXYCODONE-ACETAMINOPHEN 5-325 MG PO TABS: 1.0000 | ORAL_TABLET | ORAL | Status: AC | PRN

## 2015-04-08 NOTE — Progress Notes (Signed)
Patient understands all discharge instructions and the need to make follow up appointments. Patient discharge via wheelchair with auxillary. 

## 2015-04-08 NOTE — Discharge Summary (Signed)
Obstetric Discharge Summary Reason for Admission: onset of labor Prenatal Procedures: none Intrapartum Procedures: cesarean: low cervical, transverse Postpartum Procedures: P.P. tubal ligation Complications-Operative and Postpartum: hemorrhage HEMOGLOBIN  Date Value Ref Range Status  04/08/2015 8.5* 12.0 - 16.0 g/dL Final    Comment:    RESULT REPEATED AND VERIFIED   HGB  Date Value Ref Range Status  09/09/2014 11.5* 12.0-16.0 g/dL Final   HCT  Date Value Ref Range Status  04/08/2015 25.9* 35.0 - 47.0 % Final  09/09/2014 34.7* 35.0-47.0 % Final    Physical Exam:  General: alert Lochia: appropriate Uterine Fundus: firm Incision: healing well DVT Evaluation: No evidence of DVT seen on physical exam.  Discharge Diagnoses: Term Pregnancy-delivered  Discharge Information: Date: 04/08/2015 Activity: unrestricted Diet: routine Medications: PNV, Iron and Percocet Condition: stable Instructions: refer to practice specific booklet and fu with Dr Dalbert Garnet in 2 weeks at [facility] Discharge to: home   Newborn Data: Live born female  Birth Weight: 6 lb 0.3 oz (2730 g) APGAR: 5, 7  Home with mother.  Milon Score W 04/08/2015, 9:44 AM

## 2015-04-08 NOTE — Discharge Instructions (Signed)
Follow up with Dr Dalbert Garnet in 2 weeks post-op at the [facility]. Call [phone] for apptBleeding: Your bleeding could continue up to 6 weeks, the flow should gradually decrease and the color should become dark then lightened over the next couple of weeks. If you notice you are bleeding heavily or passing clots larger than the size of your fist, PLEASE call your physician. No TAMPONS, DOUCHING, ENEMAS OR SEXUAL INTERCOURSE for 6 weeks.   Stitches: Shower daily with mild soap and water. Stitches will dissolve over the next couple of weeks, if you experience any discomfort in the vaginal area you may sit in warm water 15-20 minutes, 3-4 times per day. Just enough water to cover vaginal area.   AfterPains: This is the uterus contracting back to its normal position and size. Use medications prescribed or recommended by your physician to help relieve this discomfort.   Bowels/Hemorrhoids: Drink plenty of water and stay active. Increase fiber, fresh fruits and vegetables in your diet.   Rest/Activity: Rest when the baby is resting  Bathing: Shower daily!  Diet: Continue to eat extra calories until your follow up visit to help replenish nutrients and vitamins. If breastfeeding eat an extra [phone] and increase your fluid intake to 12 glasses a day.   Contraception: Consult with your physician on what method of birth control you would like to use.   Postpartum "BLUES": It is common to emotional days after delivery, however if it persist for greater than 2 weeks or if you feel concerned please let your physician know immediately. This is hormone driven and nothing you can control so please let someone know how you feel.  Follow Up Visit: Please schedule a follow up visit with your physician. Incision Care: Keep incision area clean, dry and open to air. Shower daily to prevent infection. Only pat incisions, no rubbing or circling the incision area. Use mild soap and warm water to clean  incisions. Make sure to dry area completely.  Monitor incision area for redness, severe pain, drainage, or odor, if so contact your physician.  No heavy lifting until cleared by physician.  Take pain medications to manage pain, if pain is increased or unrelieved by medications contact your physician.   Make sure to stay active, ambulate often.  Call your physician if you develop a temperature greater than 100.4, experience any chest pain, shortness of breath.  Make sure to follow up with physician within specified time.

## 2016-05-30 IMAGING — US US OB US >=[ID] SNGL FETUS
1 series · 14 of 28 positions shown · non-contrast
Comparison: none

CLINICAL DATA: Anatomy

EXAM:
ULTRASOUND OB >=[id number] SINGLE FETUS

[Series 1: us ob us >=(id) sngl fetus · 0.25mm/px · 14 of 173 slices shown]
[im 7/173]
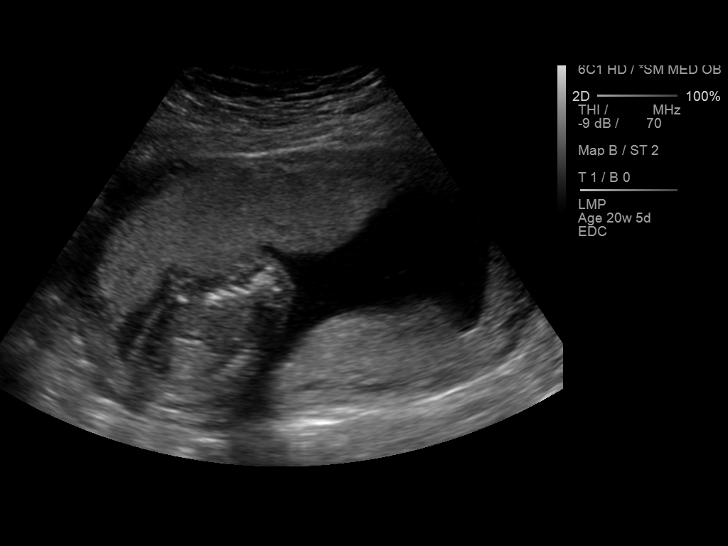
[im 20/173]
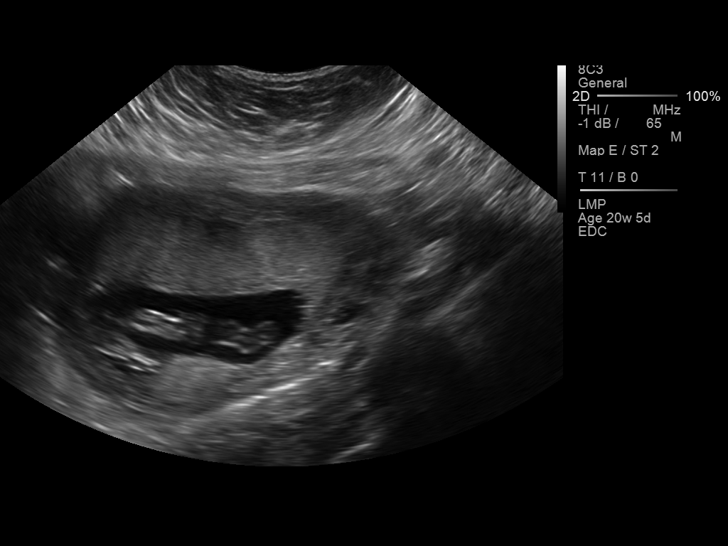
[im 32/173]
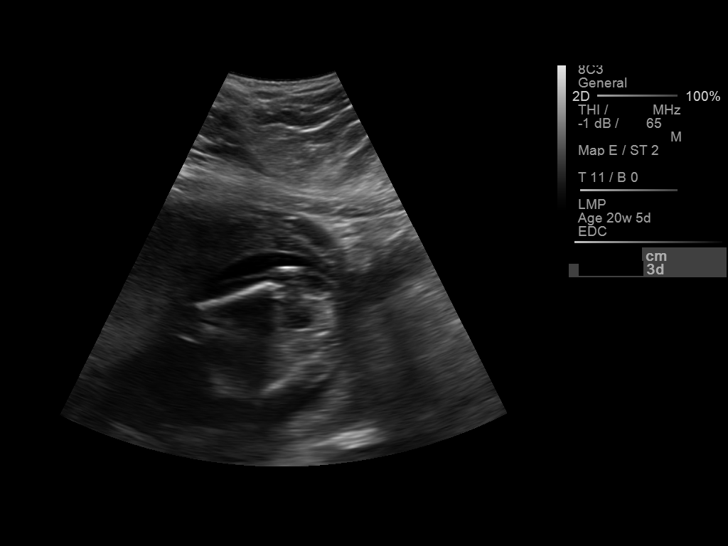
[im 45/173]
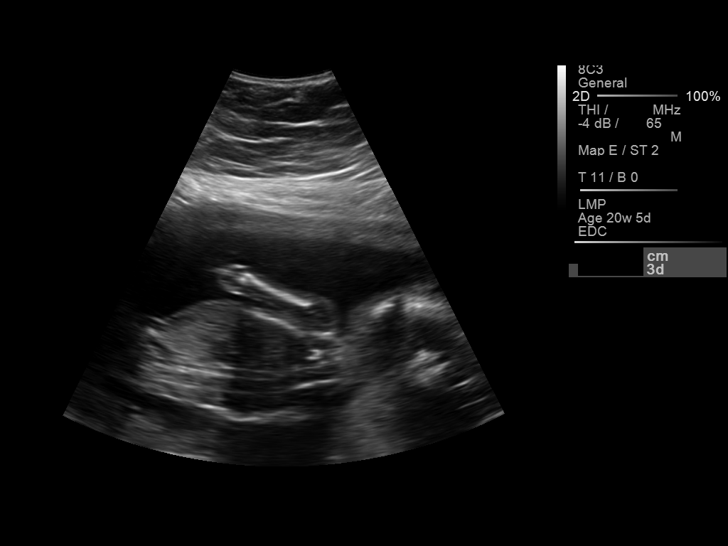
[im 58/173]
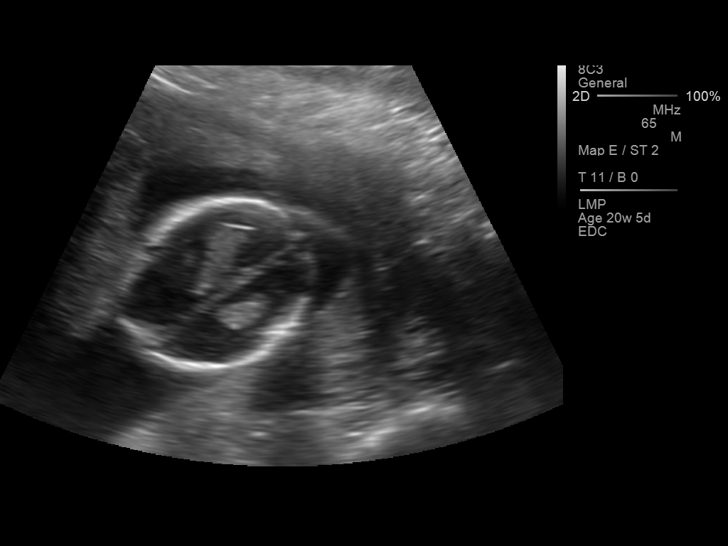
[im 71/173]
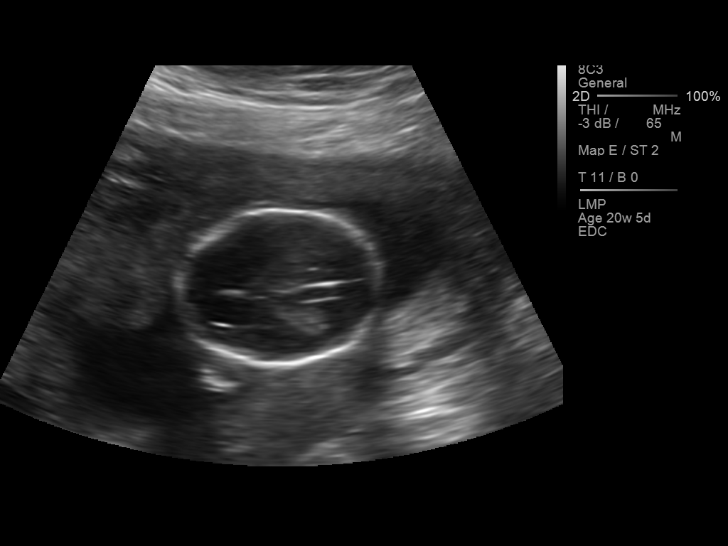
[im 83/173]
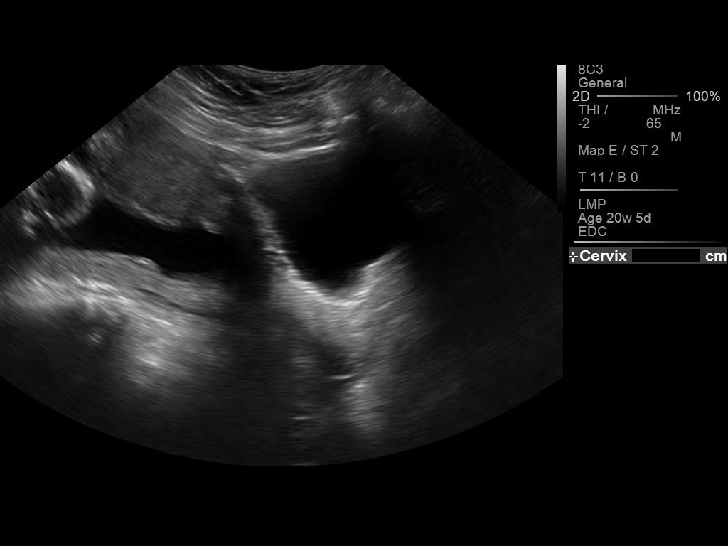
[im 96/173]
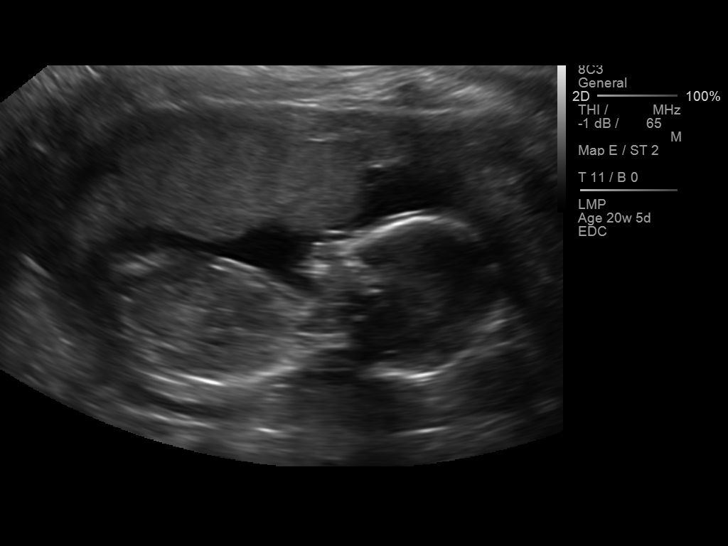
[im 109/173]
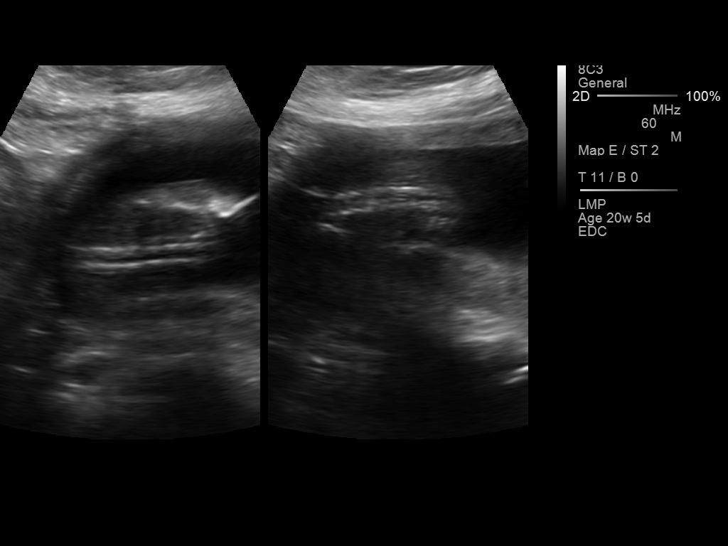
[im 122/173]
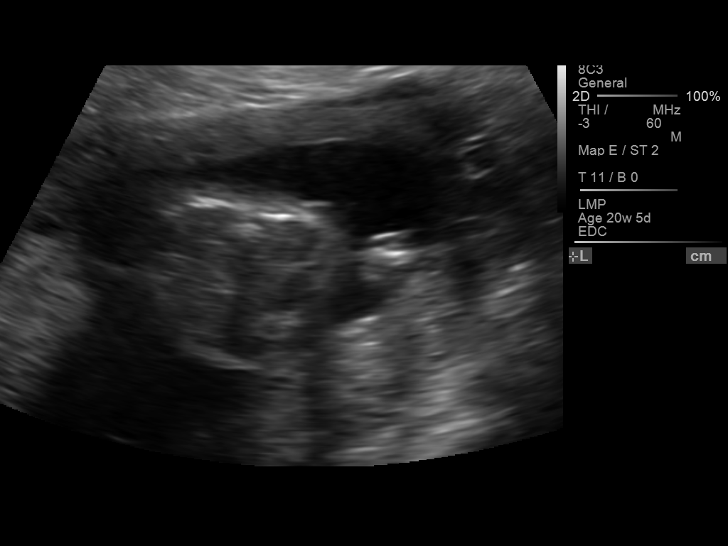
[im 134/173]
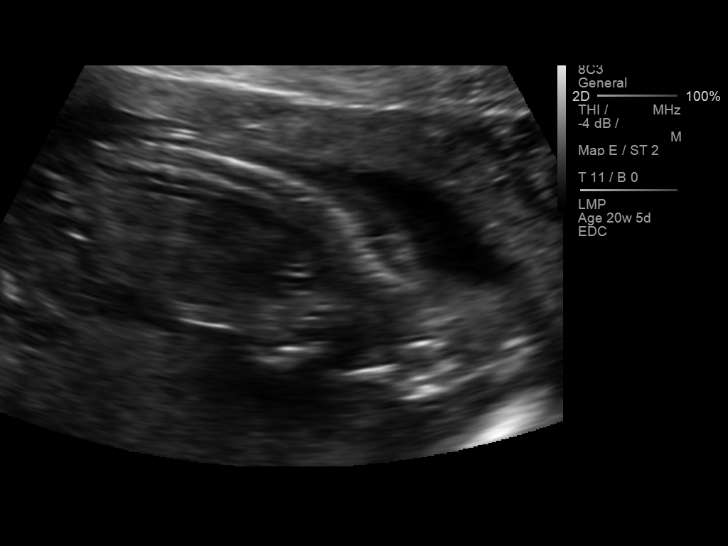
[im 147/173]
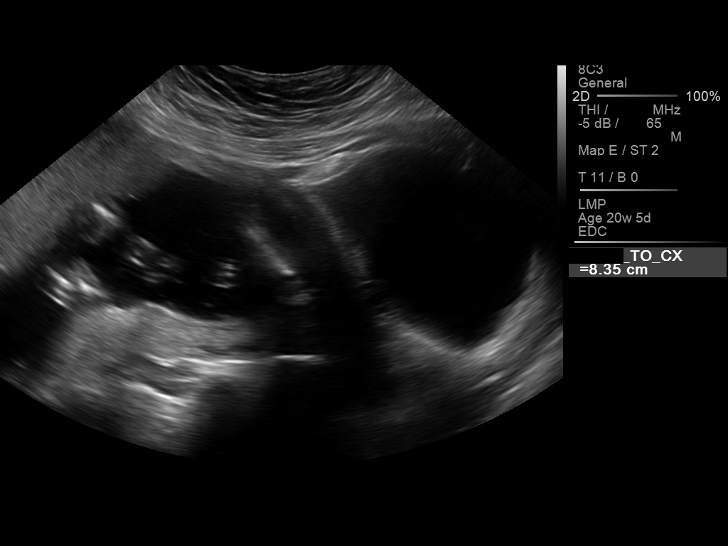
[im 160/173]
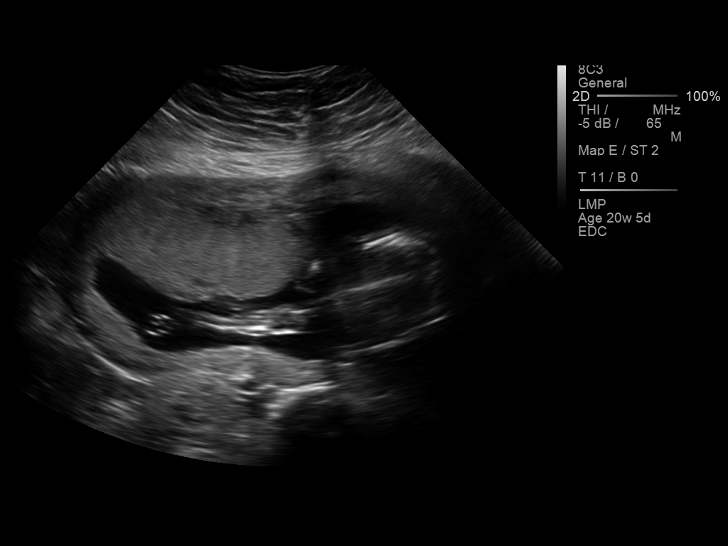
[im 173/173]
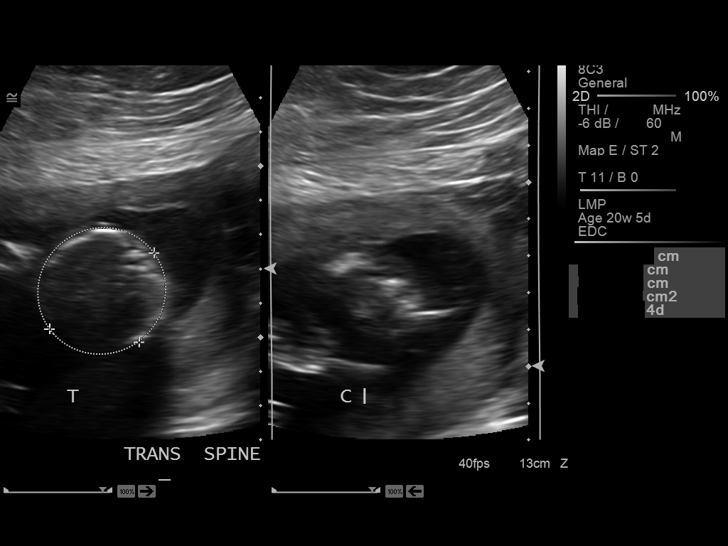

[14 of 28 positions shown; findings below may reference images not displayed]

FINDINGS: Number of Fetuses: 1

Heart Rate:  150 bpm

Movement: Yes

Presentation: Cephalic

Previa: No

Placental Location: Anterior right lateral

Amniotic Fluid (Subjective): Normal

Amniotic Fluid (Objective):

Vertical pocket 2.9cm

FETAL BIOMETRY

BPD:  3.8cm 17w 4d

HC:    14cm  17w   2d

AC:   11.8cm  17w   4d

FL:   2.5cm  17w   3d

Current Mean GA: 17w 3d              US EDC: 04/11/2015

FETAL ANATOMY

Lateral Ventricles: Appears normal

Thalami/CSP: Appears normal

Posterior Fossa:  Appears normal

Nuchal Region: Appears normal    NFT= 3.3mm

Upper Lip: Not visualized

Spine: Appears normal

4 Chamber Heart on Left: Appears normal

LVOT: Appears normal

RVOT: Appears normal

Stomach on Left: Appears normal

3 Vessel Cord: Appears normal

Cord Insertion site: Appears normal

Kidneys: Appears normal

Bladder: Appears normal

Extremities: Appears normal

Technically difficult due to: None

Other:  The cord insertion is along the margin of the placenta.

Maternal Findings:

Cervix:  3.5 cm.  Closed.
IMPRESSION: Single live intrauterine pregnancy as detailed above.

## 2016-12-25 ENCOUNTER — Ambulatory Visit
Admission: RE | Admit: 2016-12-25 | Discharge: 2016-12-25 | Disposition: A | Discharge status: Home / Self Care | Payer: Self-pay | Source: Ambulatory Visit | Attending: Family Medicine | Admitting: Family Medicine

## 2016-12-25 ENCOUNTER — Other Ambulatory Visit (HOSPITAL_COMMUNITY): Payer: Self-pay | Admitting: Family Medicine

## 2016-12-25 DIAGNOSIS — R918 Other nonspecific abnormal finding of lung field: Secondary | ICD-10-CM | POA: Insufficient documentation

## 2016-12-25 DIAGNOSIS — R7611 Nonspecific reaction to tuberculin skin test without active tuberculosis: Secondary | ICD-10-CM

## 2018-07-21 IMAGING — CR DG CHEST 1V
1 series · 1 of 1 positions shown · non-contrast
Comparison: Chest x-ray of March 28, 2008

CLINICAL DATA: Positive PPD.  No clinical history of active TB.

EXAM:
CHEST 1 VIEW

[dg chest 1 view]
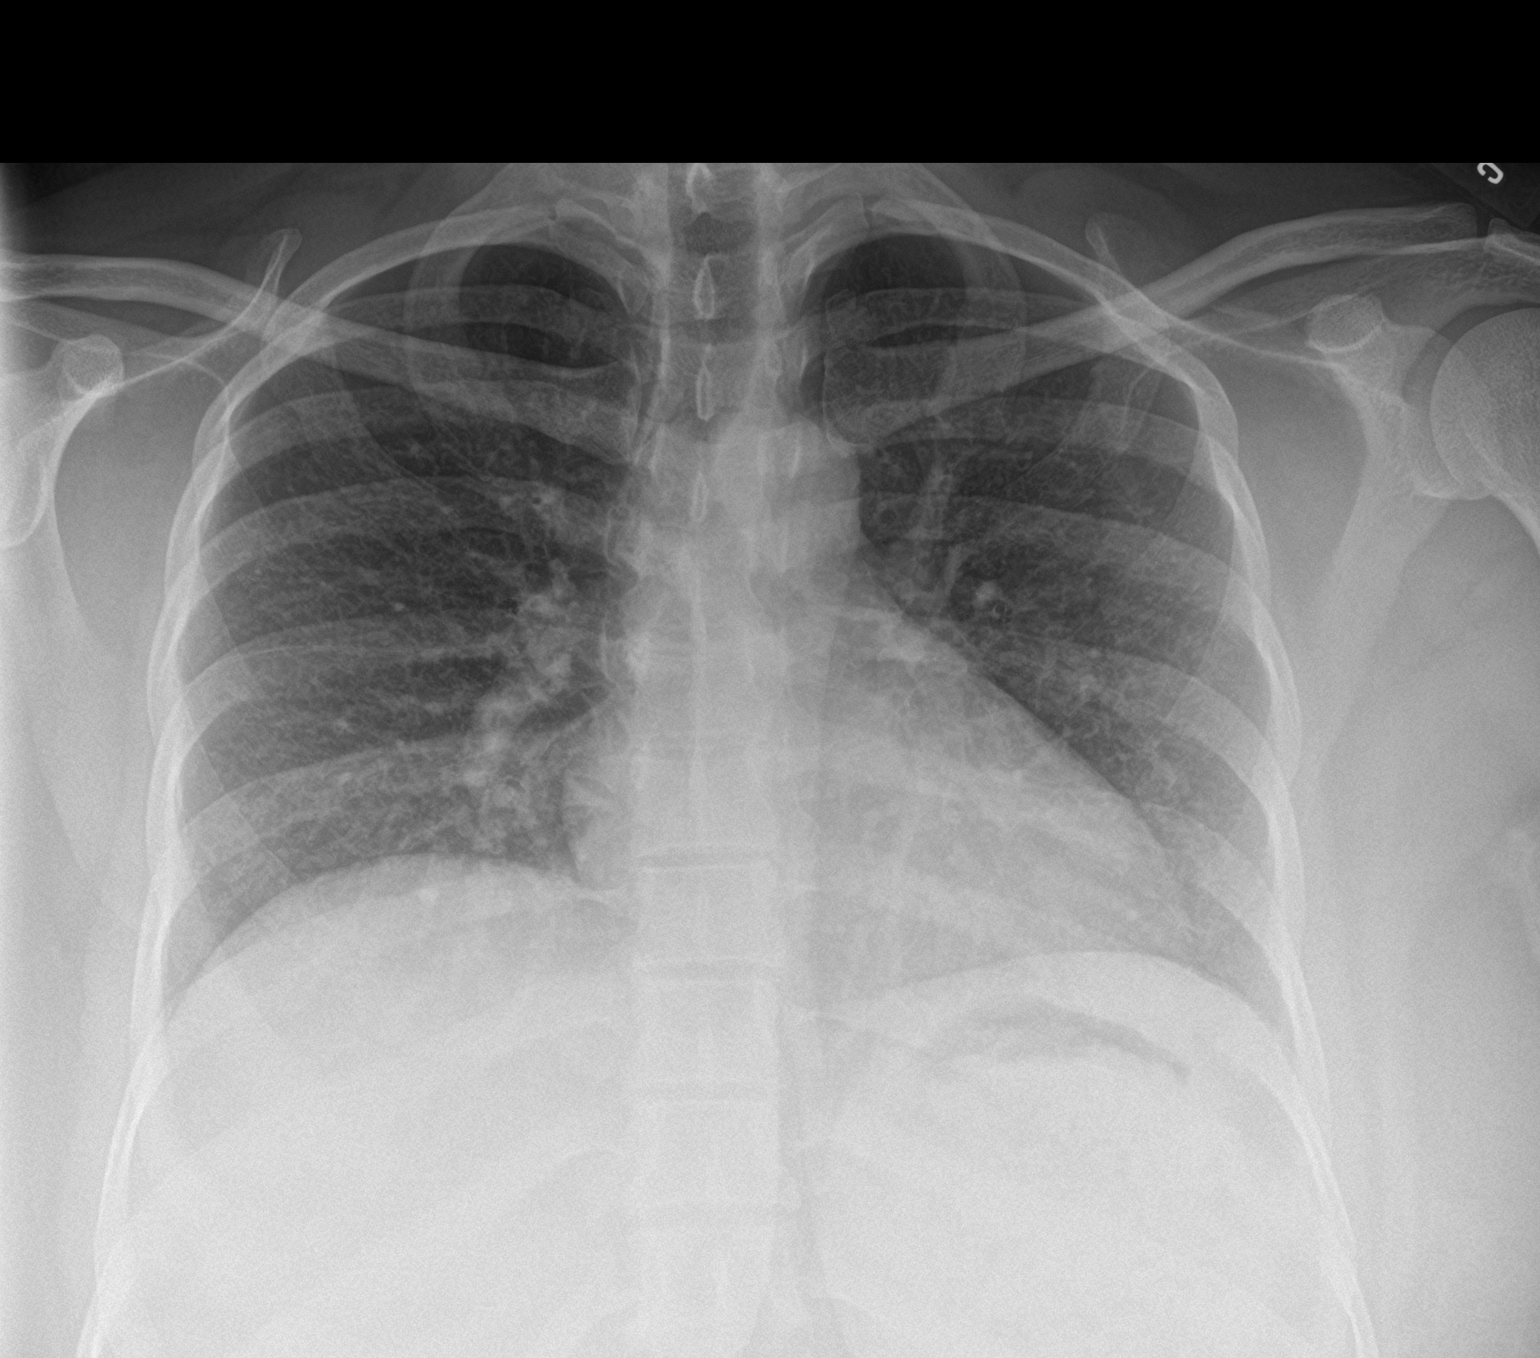

[1 of 1 positions shown; findings below may reference images not displayed]

FINDINGS: The lungs are mildly hypoinflated. The interstitial markings are
coarse likely secondary to the hypoinflation. The heart is
top-normal in size. The pulmonary vascularity is not engorged. The
mediastinum is normal in width. There is no pleural effusion. The
bony thorax exhibits no acute abnormality.
IMPRESSION: No objective evidence of active tuberculosis nor other acute
cardiopulmonary abnormality. Mild interstitial prominence is felt
related to the hypoinflation.

## 2019-04-27 ENCOUNTER — Telehealth: Payer: Self-pay

## 2019-04-27 DIAGNOSIS — Z20822 Contact with and (suspected) exposure to covid-19: Secondary | ICD-10-CM

## 2019-04-27 NOTE — Telephone Encounter (Signed)
Pt. Scheduled for tomorrow. Practice # [phone] Fax [phone]

## 2019-04-27 NOTE — Telephone Encounter (Signed)
Kerri Perches PA, with [facility] in [place], request COVID 19 test for exposure and symptoms.Scheduled for

## 2019-04-28 ENCOUNTER — Other Ambulatory Visit: Payer: Self-pay

## 2019-04-28 DIAGNOSIS — Z20822 Contact with and (suspected) exposure to covid-19: Secondary | ICD-10-CM

## 2019-05-02 LAB — NOVEL CORONAVIRUS, NAA: SARS-CoV-2, NAA: NOT DETECTED

## 2019-09-02 ENCOUNTER — Other Ambulatory Visit: Payer: Self-pay

## 2019-09-02 DIAGNOSIS — Z20822 Contact with and (suspected) exposure to covid-19: Secondary | ICD-10-CM

## 2019-09-04 LAB — NOVEL CORONAVIRUS, NAA: SARS-CoV-2, NAA: NOT DETECTED

## 2024-03-17 ENCOUNTER — Telehealth: Payer: Self-pay | Admitting: *Deleted

## 2024-03-23 ENCOUNTER — Encounter: Payer: Self-pay | Admitting: Physician Assistant

## 2024-04-01 ENCOUNTER — Other Ambulatory Visit: Payer: Self-pay | Admitting: Family Medicine

## 2024-04-01 DIAGNOSIS — Z1231 Encounter for screening mammogram for malignant neoplasm of breast: Secondary | ICD-10-CM

## 2024-04-15 ENCOUNTER — Ambulatory Visit
Admission: RE | Admit: 2024-04-15 | Discharge: 2024-04-15 | Disposition: A | Discharge status: Home / Self Care | Payer: Self-pay | Source: Ambulatory Visit | Attending: Family Medicine | Admitting: Family Medicine

## 2024-04-15 ENCOUNTER — Encounter: Payer: Self-pay | Admitting: Radiology

## 2024-04-15 DIAGNOSIS — Z1231 Encounter for screening mammogram for malignant neoplasm of breast: Secondary | ICD-10-CM | POA: Insufficient documentation
